# Patient Record
Sex: Female | Born: 1935 | Race: Asian | Hispanic: No | Marital: Married | Smoking: Never smoker
Health system: Southern US, Community
[De-identification: ages and names within clinical notes are randomized; demographics above are authoritative.]

## PROBLEM LIST (undated history)

## (undated) DIAGNOSIS — N390 Urinary tract infection, site not specified: Secondary | ICD-10-CM

---

## 2008-02-07 ENCOUNTER — Emergency Department (HOSPITAL_COMMUNITY): Admission: EM | Admit: 2008-02-07 | Discharge: 2008-02-07 | Payer: Self-pay | Admitting: Emergency Medicine

## 2008-08-20 ENCOUNTER — Emergency Department (HOSPITAL_COMMUNITY): Admission: EM | Admit: 2008-08-20 | Discharge: 2008-08-20 | Payer: Self-pay | Admitting: Emergency Medicine

## 2008-11-09 ENCOUNTER — Emergency Department (HOSPITAL_COMMUNITY): Admission: EM | Admit: 2008-11-09 | Discharge: 2008-11-09 | Payer: Self-pay | Admitting: Emergency Medicine

## 2008-11-13 ENCOUNTER — Emergency Department (HOSPITAL_COMMUNITY): Admission: EM | Admit: 2008-11-13 | Discharge: 2008-11-13 | Payer: Self-pay | Admitting: Family Medicine

## 2009-04-24 ENCOUNTER — Emergency Department (HOSPITAL_COMMUNITY): Admission: EM | Admit: 2009-04-24 | Discharge: 2009-04-24 | Payer: Self-pay | Admitting: Family Medicine

## 2009-12-22 IMAGING — CR DG RIBS W/ CHEST 3+V*L*
3 series · 3 of 3 positions shown · non-contrast
Comparison: Chest x-ray 08/20/2008

CLINICAL DATA: Fell

LEFT RIBS AND CHEST - 3+ VIEW

[w chest pa]
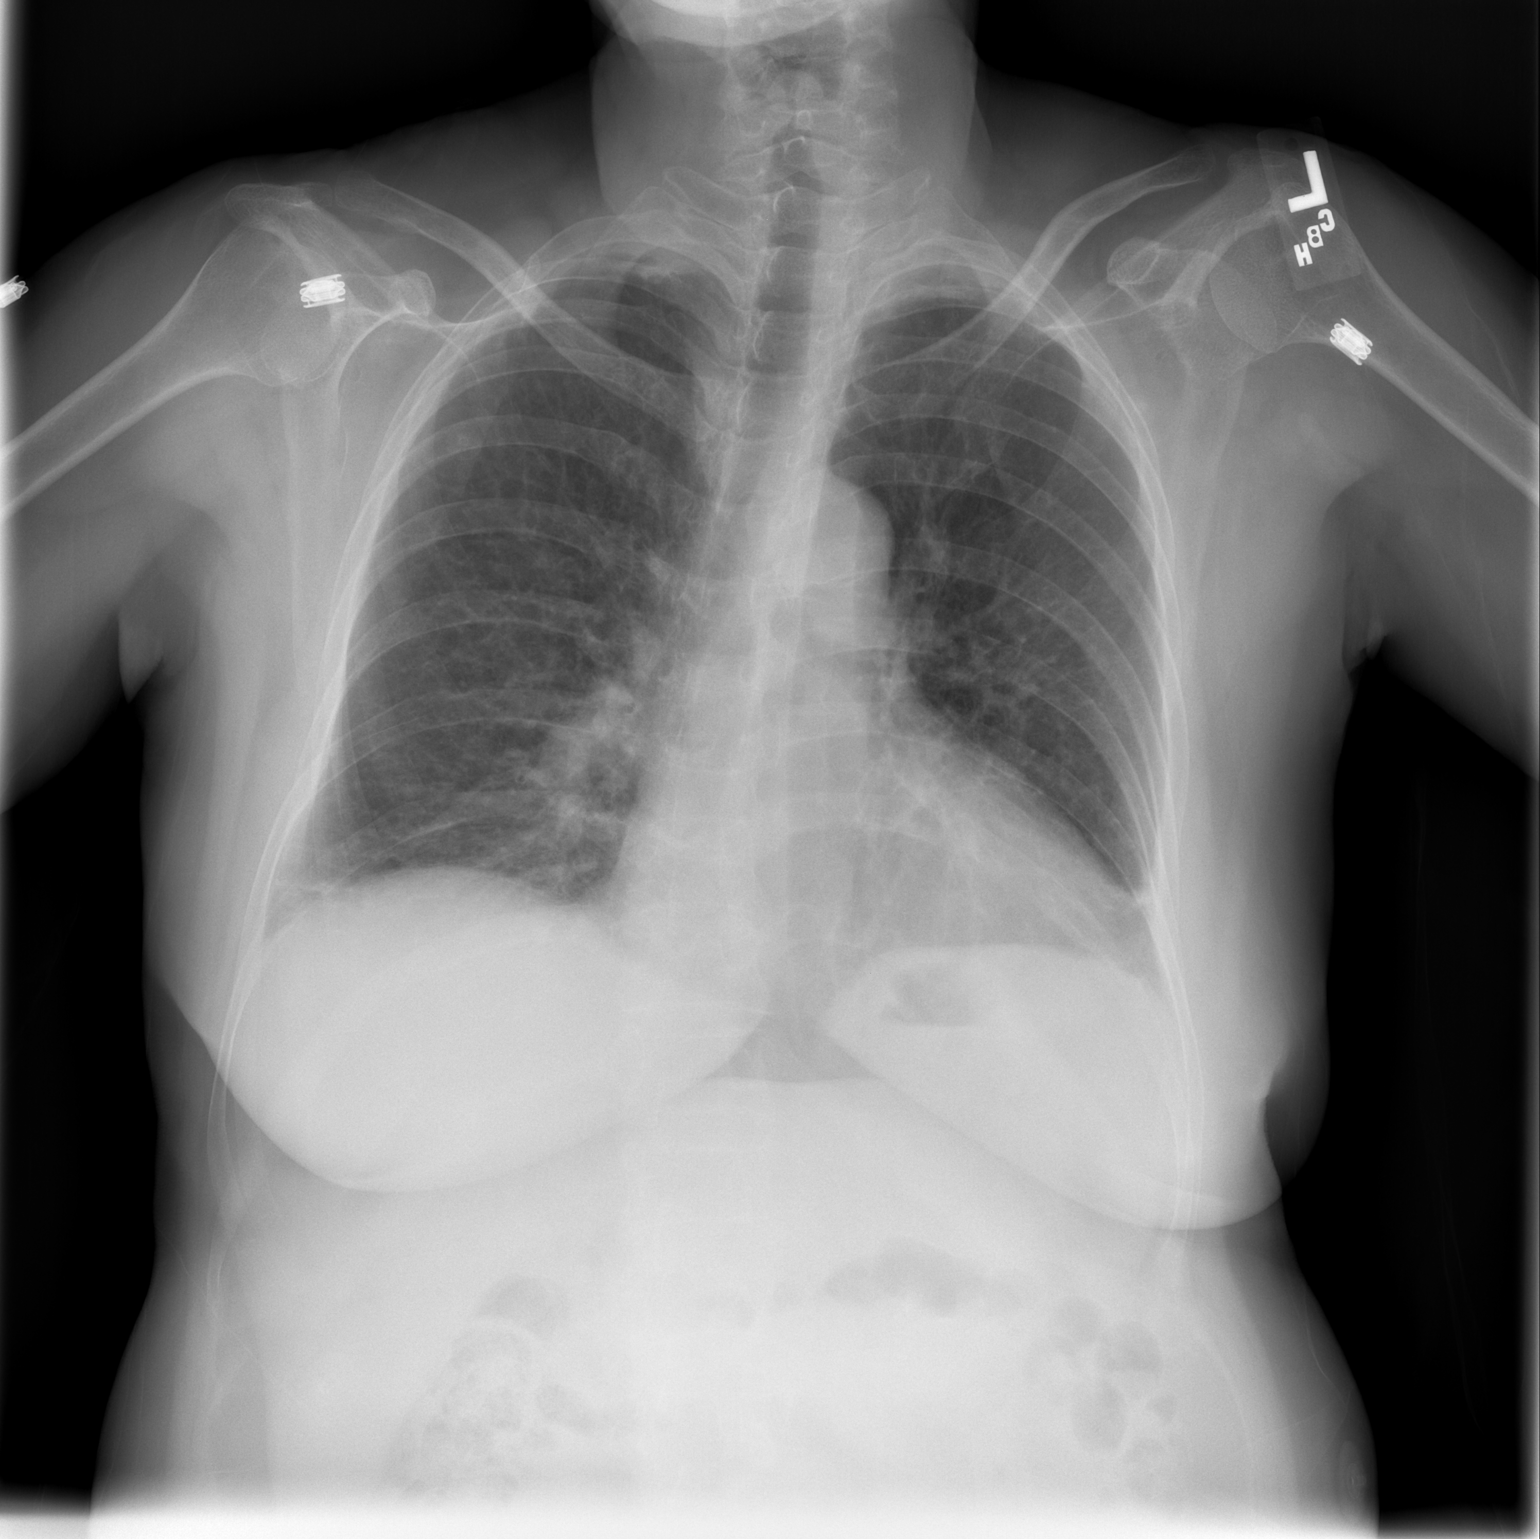

[w ribs ap/pa upper left]
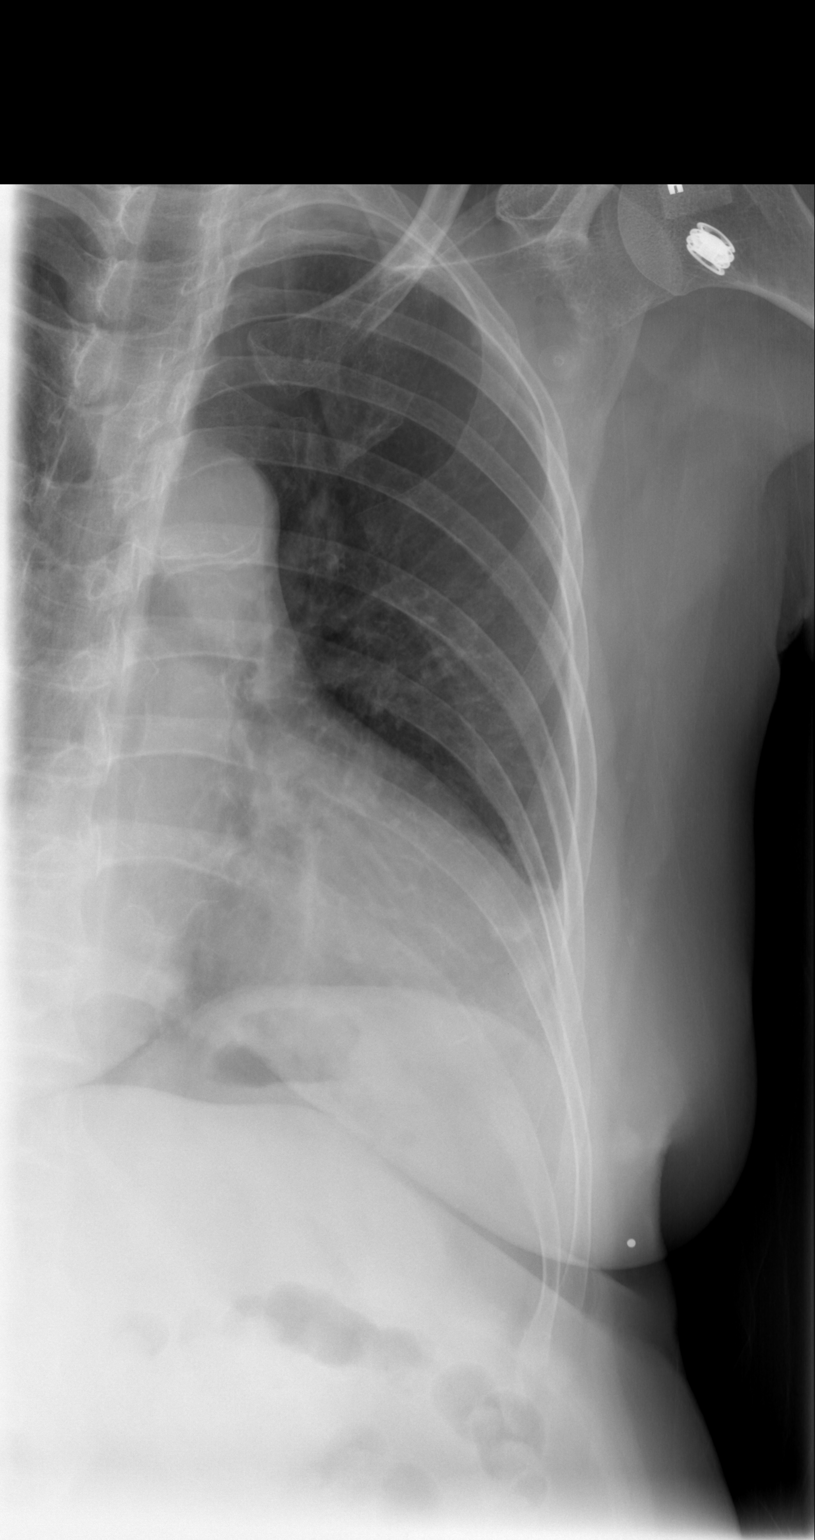

[w ribs oblique left]
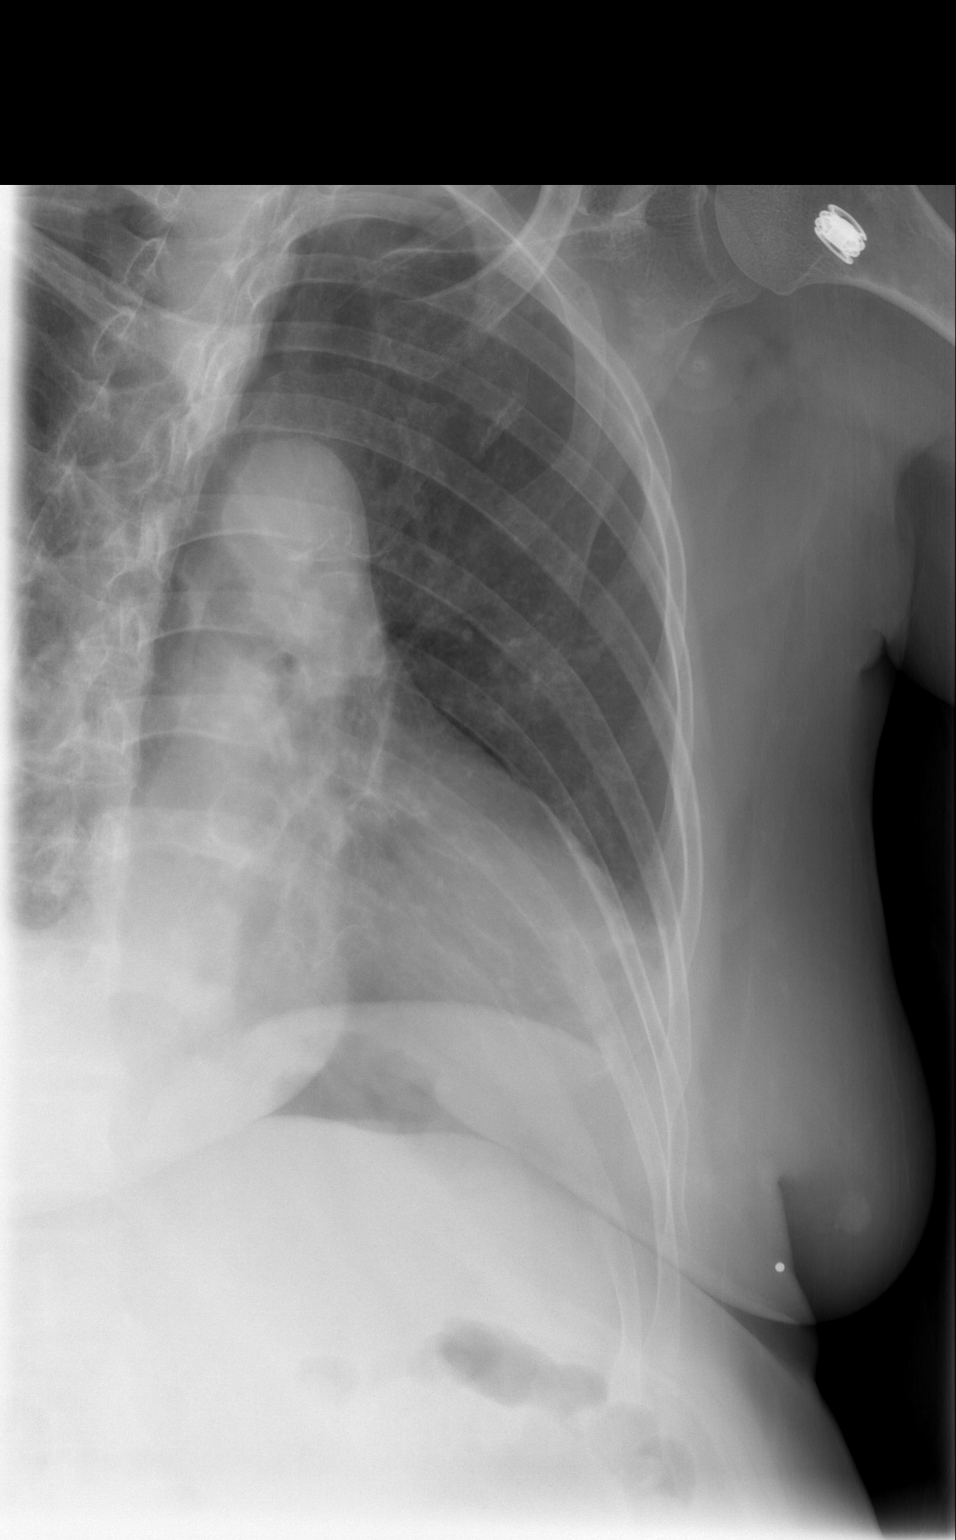

[3 of 3 positions shown; findings below may reference images not displayed]

FINDINGS: The heart is enlarged but stable.  The mediastinal and
hilar contours appear normal.  There are chronic lung changes but
no definite acute pulmonary findings.  Apical scarring is
unchanged.

Dedicated views of the left ribs demonstrate no definite left-sided
rib fractures.  No pleural thickening, pleural effusion or
pneumothorax.
IMPRESSION: 1.  No acute cardiopulmonary findings.  Stable cardiac enlargement.
Minimal streaky bibasilar atelectasis.
2.  No definite acute left-sided rib fractures.
3.  Chronic lung changes.

## 2010-04-03 ENCOUNTER — Emergency Department (HOSPITAL_COMMUNITY): Admission: EM | Admit: 2010-04-03 | Discharge: 2010-04-03 | Payer: Self-pay | Admitting: Family Medicine

## 2010-09-05 ENCOUNTER — Emergency Department (HOSPITAL_COMMUNITY): Admission: EM | Admit: 2010-09-05 | Discharge: 2010-09-05 | Payer: Self-pay | Admitting: Family Medicine

## 2010-10-27 ENCOUNTER — Emergency Department (HOSPITAL_COMMUNITY)
Admission: EM | Admit: 2010-10-27 | Discharge: 2010-10-27 | Payer: Self-pay | Source: Home / Self Care | Admitting: Family Medicine

## 2011-01-26 LAB — POCT URINALYSIS DIP (DEVICE)
Protein, ur: NEGATIVE mg/dL
Specific Gravity, Urine: <=1.005 (ref 1.005–1.030)
Urobilinogen, UA: 0.2 mg/dL (ref 0.0–1.0)

## 2011-02-07 ENCOUNTER — Inpatient Hospital Stay (INDEPENDENT_AMBULATORY_CARE_PROVIDER_SITE_OTHER)
Admission: RE | Admit: 2011-02-07 | Discharge: 2011-02-07 | Disposition: A | Discharge status: Home / Self Care | Payer: Medicaid Other | Source: Ambulatory Visit | Attending: Family Medicine | Admitting: Family Medicine

## 2011-02-07 DIAGNOSIS — K5900 Constipation, unspecified: Secondary | ICD-10-CM

## 2011-02-07 LAB — URINALYSIS, MICROSCOPIC ONLY
Bilirubin Urine: NEGATIVE
Ketones, ur: NEGATIVE mg/dL
Protein, ur: NEGATIVE mg/dL
Urobilinogen, UA: 0.2 mg/dL (ref 0.0–1.0)

## 2011-02-07 LAB — POCT URINALYSIS DIP (DEVICE)
Glucose, UA: NEGATIVE mg/dL
Nitrite: NEGATIVE
Urobilinogen, UA: 0.2 mg/dL (ref 0.0–1.0)

## 2011-02-08 LAB — URINE CULTURE
Colony Count: NO GROWTH
Culture  Setup Time: 201203312059
Culture: NO GROWTH

## 2011-02-16 LAB — POCT URINALYSIS DIP (DEVICE): Urobilinogen, UA: 0.2 mg/dL (ref 0.0–1.0)

## 2011-07-24 ENCOUNTER — Inpatient Hospital Stay (INDEPENDENT_AMBULATORY_CARE_PROVIDER_SITE_OTHER)
Admission: RE | Admit: 2011-07-24 | Discharge: 2011-07-24 | Disposition: A | Discharge status: Home / Self Care | Payer: Medicaid Other | Source: Ambulatory Visit | Attending: Emergency Medicine | Admitting: Emergency Medicine

## 2011-07-24 DIAGNOSIS — M549 Dorsalgia, unspecified: Secondary | ICD-10-CM

## 2011-07-24 DIAGNOSIS — N390 Urinary tract infection, site not specified: Secondary | ICD-10-CM

## 2011-07-24 LAB — POCT URINALYSIS DIP (DEVICE)
Bilirubin Urine: NEGATIVE
Glucose, UA: NEGATIVE mg/dL
Nitrite: NEGATIVE
Urobilinogen, UA: 0.2 mg/dL (ref 0.0–1.0)

## 2011-07-25 LAB — URINE CULTURE: Culture  Setup Time: 201209141658

## 2011-08-03 LAB — POCT I-STAT, CHEM 8
BUN: 16
Calcium, Ion: 1.13
Chloride: 107
Creatinine, Ser: 1.2
Glucose, Bld: 119 — ABNORMAL HIGH
HCT: 38
Hemoglobin: 12.9
Potassium: 3.4 — ABNORMAL LOW
Sodium: 140
TCO2: 25

## 2011-10-08 ENCOUNTER — Emergency Department (HOSPITAL_COMMUNITY)
Admission: EM | Admit: 2011-10-08 | Discharge: 2011-10-08 | Disposition: A | Discharge status: Nursing Home (Includes ICF) | Payer: Medicaid Other | Source: Home / Self Care | Attending: Emergency Medicine | Admitting: Emergency Medicine

## 2011-10-08 ENCOUNTER — Encounter (HOSPITAL_COMMUNITY): Payer: Self-pay | Admitting: *Deleted

## 2011-10-08 ENCOUNTER — Emergency Department (HOSPITAL_COMMUNITY): Payer: Medicaid Other

## 2011-10-08 ENCOUNTER — Emergency Department (HOSPITAL_COMMUNITY)
Admission: EM | Admit: 2011-10-08 | Discharge: 2011-10-09 | Disposition: A | Discharge status: Home / Self Care | Payer: Medicaid Other | Attending: Emergency Medicine | Admitting: Emergency Medicine

## 2011-10-08 ENCOUNTER — Encounter: Payer: Self-pay | Admitting: Emergency Medicine

## 2011-10-08 DIAGNOSIS — R109 Unspecified abdominal pain: Secondary | ICD-10-CM

## 2011-10-08 DIAGNOSIS — R10819 Abdominal tenderness, unspecified site: Secondary | ICD-10-CM | POA: Insufficient documentation

## 2011-10-08 DIAGNOSIS — M25519 Pain in unspecified shoulder: Secondary | ICD-10-CM | POA: Insufficient documentation

## 2011-10-08 DIAGNOSIS — M549 Dorsalgia, unspecified: Secondary | ICD-10-CM | POA: Insufficient documentation

## 2011-10-08 HISTORY — DX: Urinary tract infection, site not specified: N39.0

## 2011-10-08 LAB — URINALYSIS, ROUTINE W REFLEX MICROSCOPIC
Glucose, UA: NEGATIVE mg/dL
Protein, ur: NEGATIVE mg/dL
pH: 7 (ref 5.0–8.0)

## 2011-10-08 LAB — DIFFERENTIAL
Basophils Absolute: 0 10*3/uL (ref 0.0–0.1)
Basophils Relative: 0 % (ref 0–1)
Lymphocytes Relative: 45 % (ref 12–46)
Monocytes Absolute: 0.5 10*3/uL (ref 0.1–1.0)
Neutro Abs: 2.9 10*3/uL (ref 1.7–7.7)
Neutrophils Relative %: 42 % — ABNORMAL LOW (ref 43–77)

## 2011-10-08 LAB — CBC
HCT: 34.5 % — ABNORMAL LOW (ref 36.0–46.0)
MCHC: 32.5 g/dL (ref 30.0–36.0)
Platelets: 180 10*3/uL (ref 150–400)
RDW: 12.9 % (ref 11.5–15.5)
WBC: 6.9 10*3/uL (ref 4.0–10.5)

## 2011-10-08 LAB — COMPREHENSIVE METABOLIC PANEL
ALT: 6 U/L (ref 0–35)
AST: 22 U/L (ref 0–37)
Albumin: 4 g/dL (ref 3.5–5.2)
Alkaline Phosphatase: 93 U/L (ref 39–117)
CO2: 27 mEq/L (ref 19–32)
Chloride: 103 mEq/L (ref 96–112)
Creatinine, Ser: 0.87 mg/dL (ref 0.50–1.10)
GFR calc non Af Amer: 65 mL/min — ABNORMAL LOW (ref >90)
Potassium: 3.4 mEq/L — ABNORMAL LOW (ref 3.5–5.1)
Sodium: 139 mEq/L (ref 135–145)
Total Bilirubin: 0.3 mg/dL (ref 0.3–1.2)

## 2011-10-08 LAB — URINE MICROSCOPIC-ADD ON

## 2011-10-08 MED ORDER — MORPHINE SULFATE 2 MG/ML IJ SOLN
2.0000 mg | Freq: Once | INTRAMUSCULAR | Status: AC
  Administered 2011-10-08: 2 mg via INTRAVENOUS
  Filled 2011-10-08: qty 1

## 2011-10-08 MED ORDER — ONDANSETRON HCL 4 MG/2ML IJ SOLN
4.0000 mg | Freq: Once | INTRAMUSCULAR | Status: AC
  Administered 2011-10-08: 4 mg via INTRAMUSCULAR
  Filled 2011-10-08: qty 2

## 2011-10-08 MED ORDER — SODIUM CHLORIDE 0.9 % IV SOLN
Freq: Once | INTRAVENOUS | Status: AC
  Administered 2011-10-08: 23:00:00 via INTRAVENOUS

## 2011-10-08 NOTE — ED Provider Notes (Signed)
History     CSN: [id number] Arrival date & time: 10/08/2011  3:02 PM   First MD Initiated Contact with Patient 10/08/11 1602      Chief Complaint  Patient presents with  . Abdominal Pain    HPI Comments: 75 y/o female with intermittent burning RUQ pain rad to R shoulder with nausea x 2 days. Pain lasts approx 35 min and resolves. Slightly worse with walking, movement. Reports abd distension, constipation. Last effective BM 1 week ago but had small hard BM yesterday. No improvement in pain with BM.  No vomiting, fevers, wheezing, SOB, cough, urinary c/o. Reports intermittent substernal "burning" pain when lying down and when having this abd pain over past 2 days. Has never had this before. No exertional component. No diaphoresis, palpitations with chest discomfort.  States car ride over here uncomfortable. Pt with no cardiac history.  Patient is a 75 y.o. female presenting with abdominal pain. The history is provided by the patient and a relative. The history is limited by a language barrier. A language interpreter was used.  Abdominal Pain The primary symptoms of the illness include abdominal pain and nausea. The primary symptoms of the illness do not include fever, shortness of breath, vomiting, diarrhea, hematemesis, hematochezia or dysuria. The current episode started 2 days ago. The problem has been gradually worsening.  The patient has had a change in bowel habit. Risk factors for an acute abdominal problem include being elderly. Additional symptoms associated with the illness include heartburn, constipation and back pain. Symptoms associated with the illness do not include diaphoresis, urgency, hematuria or frequency.    Past Medical History  Diagnosis Date  . Constipation   . UTI (lower urinary tract infection)     History reviewed. No pertinent past surgical history.  History reviewed. No pertinent family history.  History  Substance Use Topics  . Smoking status: Never Smoker    . Smokeless tobacco: Not on file  . Alcohol Use: No    OB History    Grav Para Term Preterm Abortions TAB SAB Ect Mult Living                  Review of Systems  Constitutional: Negative for fever and diaphoresis.  Respiratory: Negative for cough, shortness of breath and wheezing.   Cardiovascular: Positive for chest pain.  Gastrointestinal: Positive for heartburn, nausea, abdominal pain, constipation and abdominal distention. Negative for vomiting, diarrhea, hematochezia and hematemesis.  Genitourinary: Negative for dysuria, urgency, frequency and hematuria.  Musculoskeletal: Positive for back pain.  Skin: Negative for rash.    Allergies  Review of patient's allergies indicates no known allergies.  Home Medications  No current outpatient prescriptions on file.  BP 128/67  Pulse 65  Temp(Src) 98.1 F (36.7 C) (Oral)  Resp 18  SpO2 98%  Physical Exam  Nursing note and vitals reviewed. Constitutional: She is oriented to person, place, and time. She appears well-developed and well-nourished.  HENT:  Head: Normocephalic and atraumatic.  Eyes: Conjunctivae and EOM are normal. Pupils are equal, round, and reactive to light.       Conjunctiva pale  Neck: Normal range of motion. Neck supple.  Cardiovascular: Normal rate, regular rhythm, normal heart sounds and intact distal pulses.   No murmur heard. Pulmonary/Chest: Effort normal and breath sounds normal. No respiratory distress. She has no wheezes. She has no rales. She exhibits no tenderness.  Abdominal: Soft. Bowel sounds are normal. She exhibits no distension and no mass. There is tenderness in  the right upper quadrant. There is no rigidity, no rebound, no guarding, no CVA tenderness, no tenderness at McBurney's point and negative Murphy's sign.  Musculoskeletal: Normal range of motion. She exhibits no edema and no tenderness.  Neurological: She is alert and oriented to person, place, and time.  Skin: Skin is warm and  dry.  Psychiatric: She has a normal mood and affect. Her behavior is normal. Judgment and thought content normal.    ED Course  Procedures (including critical care time)  Labs Reviewed - No data to display No results found.   1. Abdominal pain     MDM  H&P most c/w gallbladder irritation with component of constipation. Hx of chest discomfort suggestive of reflux.  Pt has no follow up, speaks no english. Transferring to ED for further evaluation.  Luiz Blare, MD 10/08/11 [phone]

## 2011-10-08 NOTE — ED Notes (Signed)
2 day history of right side pain and shoulder.  Reports right shoulder pain and neck, pain started in right upper chest then moved downward.  Pain hurts into back.  Pain is burning under ribs.  Intermittent nausea/vomitted.  Unsure of last bowel movement, reports stool as hard.

## 2011-10-08 NOTE — ED Notes (Signed)
Pt is here from [facility] for further evaluation of right side pain.  Pain is in her right shoulder and down her right side.  Pt has burning with urination.  Pt has had some chills with this also.

## 2011-10-08 NOTE — ED Provider Notes (Signed)
History     CSN: [id number] Arrival date & time: 10/08/2011  6:45 PM   First MD Initiated Contact with Patient 10/08/11 2218      Chief Complaint  Patient presents with  . Shoulder Pain  . Flank Pain    (Consider location/radiation/quality/duration/timing/severity/associated sxs/prior treatment) Patient is a 75 y.o. female presenting with shoulder pain and flank pain. The history is provided by the patient. The history is limited by a language barrier. Language Interpreter Used: Family at bedside are interpreting.  Shoulder Pain This is a new problem. The current episode started yesterday. The problem occurs intermittently. The problem has been unchanged. Associated symptoms include abdominal pain. Pertinent negatives include no chills or fever. Associated symptoms comments: The pain originates in the RUQ abdomen and extends to right upper back and shoulder. She denies any association with eating. No cough or fever. . Exacerbated by: Worse with certain movements.  Flank Pain Associated symptoms include abdominal pain. Pertinent negatives include no chills or fever.  Shoulder Pain This is a new problem. The current episode started yesterday. The problem occurs intermittently. The problem has been unchanged. Associated symptoms include abdominal pain. Associated symptoms comments: The pain originates in the RUQ abdomen and extends to right upper back and shoulder. She denies any association with eating. No cough or fever. . Exacerbated by: Worse with certain movements.  Flank Pain Associated symptoms include abdominal pain.    Past Medical History  Diagnosis Date  . Constipation   . UTI (lower urinary tract infection)     History reviewed. No pertinent past surgical history.  No family history on file.  History  Substance Use Topics  . Smoking status: Never Smoker   . Smokeless tobacco: Not on file  . Alcohol Use: No    OB History    Grav Para Term Preterm Abortions TAB SAB  Ect Mult Living                  Review of Systems  Constitutional: Negative for fever and chills.  HENT: Negative.   Respiratory: Negative.   Cardiovascular: Negative.   Gastrointestinal: Positive for abdominal pain.       RUQ abdominal pain.  Genitourinary: Positive for flank pain.  Musculoskeletal: Negative.   Skin: Negative.   Neurological: Negative.     Allergies  Review of patient's allergies indicates no known allergies.  Home Medications  No current outpatient prescriptions on file.  BP 151/78  Pulse 79  Temp(Src) 97.8 F (36.6 C) (Oral)  Resp 15  SpO2 99%  Physical Exam  Constitutional: She appears well-developed and well-nourished.  HENT:  Head: Normocephalic.  Neck: Normal range of motion. Neck supple.  Cardiovascular: Normal rate and regular rhythm.   Pulmonary/Chest: Effort normal and breath sounds normal.  Abdominal: Soft. Bowel sounds are normal. She exhibits no mass. There is tenderness. There is no rebound and no guarding.       RUQ abdominal pain to palpation that extends to right lateral abdominal wall. No distention, swelling or discoloration. Normal BS. No mass or splenomegaly.  Musculoskeletal: Normal range of motion.  Neurological: She is alert. No cranial nerve deficit.  Skin: Skin is warm and dry. No rash noted.  Psychiatric: She has a normal mood and affect.    ED Course  Procedures (including critical care time)  Results for orders placed during the hospital encounter of 10/08/11  URINALYSIS, ROUTINE W REFLEX MICROSCOPIC      Component Value Range   Color, Urine YELLOW  YELLOW    APPearance CLEAR  CLEAR    Specific Gravity, Urine 1.008  1.005 - 1.030    pH 7.0  5.0 - 8.0    Glucose, UA NEGATIVE  NEGATIVE (mg/dL)   Hgb urine dipstick SMALL (*) NEGATIVE    Bilirubin Urine NEGATIVE  NEGATIVE    Ketones, ur NEGATIVE  NEGATIVE (mg/dL)   Protein, ur NEGATIVE  NEGATIVE (mg/dL)   Urobilinogen, UA 0.2  0.0 - 1.0 (mg/dL)   Nitrite  NEGATIVE  NEGATIVE    Leukocytes, UA TRACE (*) NEGATIVE   URINE MICROSCOPIC-ADD ON      Component Value Range   Squamous Epithelial / LPF FEW (*) RARE    WBC, UA 0-2  <3 (WBC/hpf)   RBC / HPF 0-2  <3 (RBC/hpf)   Bacteria, UA RARE  RARE   CBC      Component Value Range   WBC 6.9  4.0 - 10.5 (K/uL)   RBC 4.17  3.87 - 5.11 (MIL/uL)   Hemoglobin 11.2 (*) 12.0 - 15.0 (g/dL)   HCT 45.4 (*) 09.8 - 46.0 (%)   MCV 82.7  78.0 - 100.0 (fL)   MCH 26.9  26.0 - 34.0 (pg)   MCHC 32.5  30.0 - 36.0 (g/dL)   RDW 11.9  14.7 - 82.9 (%)   Platelets 180  150 - 400 (K/uL)  DIFFERENTIAL      Component Value Range   Neutrophils Relative 42 (*) 43 - 77 (%)   Neutro Abs 2.9  1.7 - 7.7 (K/uL)   Lymphocytes Relative 45  12 - 46 (%)   Lymphs Abs 3.1  0.7 - 4.0 (K/uL)   Monocytes Relative 8  3 - 12 (%)   Monocytes Absolute 0.5  0.1 - 1.0 (K/uL)   Eosinophils Relative 5  0 - 5 (%)   Eosinophils Absolute 0.4  0.0 - 0.7 (K/uL)   Basophils Relative 0  0 - 1 (%)   Basophils Absolute 0.0  0.0 - 0.1 (K/uL)  COMPREHENSIVE METABOLIC PANEL      Component Value Range   Sodium 139  135 - 145 (mEq/L)   Potassium 3.4 (*) 3.5 - 5.1 (mEq/L)   Chloride 103  96 - 112 (mEq/L)   CO2 27  19 - 32 (mEq/L)   Glucose, Bld 109 (*) 70 - 99 (mg/dL)   BUN 19  6 - 23 (mg/dL)   Creatinine, Ser 5.62  0.50 - 1.10 (mg/dL)   Calcium 9.6  8.4 - 13.0 (mg/dL)   Total Protein 7.7  6.0 - 8.3 (g/dL)   Albumin 4.0  3.5 - 5.2 (g/dL)   AST 22  0 - 37 (U/L)   ALT 6  0 - 35 (U/L)   Alkaline Phosphatase 93  39 - 117 (U/L)   Total Bilirubin 0.3  0.3 - 1.2 (mg/dL)   GFR calc non Af Amer 65 (*) >90 (mL/min)   GFR calc Af Amer 75 (*) >90 (mL/min)  LIPASE, BLOOD      Component Value Range   Lipase 33  11 - 59 (U/L)  POCT I-STAT TROPONIN I      Component Value Range   Troponin i, poc 0.00  0.00 - 0.08 (ng/mL)   Comment 3            US Abdomen Complete  10/09/2011  *RADIOLOGY REPORT*  Clinical Data:  Right upper quadrant abdominal pain.   ABDOMEN ULTRASOUND  Technique:  Complete abdominal ultrasound examination was performed including evaluation of  the liver, gallbladder, bile ducts, pancreas, kidneys, spleen, IVC, and abdominal aorta.  Comparison:  None  Findings:  Gallbladder:  The gallbladder is mildly distended but shows no evidence of calculi, wall thickening, pericholecystic fluid or sonographic Murphy's sign.  Common Bile Duct:  Normal caliber of 3 mm.  Liver:  Normal size and echotexture without focal parenchymal abnormality.  Patent portal vein with hepatopetal flow.  IVC:  Patent throughout its visualized course in the abdomen.  Pancreas:  Although the pancreas is difficult to visualize in its entirety, no focal pancreatic abnormality is identified.  Spleen:  Normal spleen size and echotexture.  Kidneys:  Right kidney measures approximately 7.7 cm.  Simple cyst of the lower pole measures 1.8 cm in greatest diameter.  The left kidney measures 8.0 cm.  Both kidneys show mild cortical thinning. No hydronephrosis.  Abdominal Aorta:  The abdominal aorta is of normal caliber.  IMPRESSION: Mildly distended gallbladder containing no calculi.  Mildly atrophic kidneys.  Original Report Authenticated By: Reola Calkins, M.D.   Dg Abd Acute W/chest  10/08/2011  *RADIOLOGY REPORT*  Clinical Data: Right-sided abdominal pain.  ACUTE ABDOMEN SERIES (ABDOMEN 2 VIEW & CHEST 1 VIEW)  Comparison: Chest x-ray dated 09/05/2010  Findings: Chest shows bibasilar scarring which is stable.  No edema or infiltrates.  Stable heart size.  Stable old left-sided rib fractures.  Abdominal films show no evidence of bowel obstruction or free air. No abnormal calcifications.  Bony structures are unremarkable.  IMPRESSION: No acute findings.  Original Report Authenticated By: Reola Calkins, M.D.     Date: 10/09/2011  Rate: 66  Rhythm: normal sinus rhythm  QRS Axis: normal  Intervals: normal  ST/T Wave abnormalities: normal  Conduction Disutrbances:none   Narrative Interpretation:   Old EKG Reviewed: none available     MDM   The patient reports she is feeling better. Lab studies and imaging resulted without significant findings. Dr. Dierdre Highman in to see patient. Will discharge home.   Rodena Medin, PA 10/09/11 [phone]  I evaluated patient for right shoulder with associated right flank pain the last 2 days. On exam she is reproducible tenderness with movement of her right shoulder as well as reproducible tenderness to palpation over her right chest wall. Negative Murphy's sign and mild right upper quadrant abdominal tenderness. No peritonitis. Ultrasound and labs obtained and reviewed as above. Condition improved with pain medicine. Family bedside and patient agreed to close followup, as well as strict return precautions for any fevers severe pain or worsening condition. Patient may benefit from a height of scan for any persistent symptoms but has no clinical cholecystitis evaluation at this time. Also given that her condition is improved in the emergency department hip films requires admission. Vital signs reviewed within normal limits, afebrile, stable for discharge home at this time. No UTI.  Sunnie Nielsen, MD 10/09/11 [phone]

## 2011-10-08 NOTE — ED Notes (Signed)
Pt to ED for eval of pain; R flank, and R shoulder started two days ago, pain with urination denies blood in urine; pt reports feeling urinary frequency;

## 2011-10-09 ENCOUNTER — Emergency Department (HOSPITAL_COMMUNITY): Payer: Medicaid Other

## 2011-10-09 LAB — POCT I-STAT TROPONIN I: Troponin i, poc: 0 ng/mL (ref 0.00–0.08)

## 2011-10-09 MED ORDER — TRAMADOL HCL 50 MG PO TABS: 50.0000 mg | ORAL_TABLET | Freq: Four times a day (QID) | ORAL | Status: AC | PRN

## 2011-10-09 NOTE — ED Notes (Signed)
Pt remains in US

## 2012-06-13 ENCOUNTER — Emergency Department (HOSPITAL_COMMUNITY)
Admission: EM | Admit: 2012-06-13 | Discharge: 2012-06-13 | Disposition: A | Discharge status: Home / Self Care | Payer: Medicaid Other | Attending: Emergency Medicine | Admitting: Emergency Medicine

## 2012-06-13 ENCOUNTER — Encounter (HOSPITAL_COMMUNITY): Payer: Self-pay | Admitting: Emergency Medicine

## 2012-06-13 DIAGNOSIS — R109 Unspecified abdominal pain: Secondary | ICD-10-CM | POA: Insufficient documentation

## 2012-06-13 DIAGNOSIS — R300 Dysuria: Secondary | ICD-10-CM

## 2012-06-13 LAB — URINALYSIS, ROUTINE W REFLEX MICROSCOPIC
Leukocytes, UA: NEGATIVE
Nitrite: NEGATIVE
Specific Gravity, Urine: 1.006 (ref 1.005–1.030)
Urobilinogen, UA: 0.2 mg/dL (ref 0.0–1.0)

## 2012-06-13 LAB — URINE MICROSCOPIC-ADD ON

## 2012-06-13 MED ORDER — PHENAZOPYRIDINE HCL 200 MG PO TABS: 200.0000 mg | ORAL_TABLET | Freq: Three times a day (TID) | ORAL | Status: AC

## 2012-06-13 NOTE — ED Provider Notes (Signed)
History     CSN: [id number]  Arrival date & time 06/13/12  1003   First MD Initiated Contact with Patient 06/13/12 1017      Chief Complaint  Patient presents with  . Back Pain  . Abdominal Pain     HPI Pt does not speak english, friend with pt translating. Pt c/o lower back and abdominal pain x2 weeks. Pt has had chill, n/v, and difficulty with urination.   Past Medical History  Diagnosis Date  . Constipation   . UTI (lower urinary tract infection)     History reviewed. No pertinent past surgical history.  No family history on file.  History  Substance Use Topics  . Smoking status: Never Smoker   . Smokeless tobacco: Not on file  . Alcohol Use: No    OB History    Grav Para Term Preterm Abortions TAB SAB Ect Mult Living                  Review of Systems  Unable to perform ROS   Allergies  Review of patient's allergies indicates no known allergies.  Home Medications   Current Outpatient Rx  Name Route Sig Dispense Refill  . PHENAZOPYRIDINE HCL 200 MG PO TABS Oral Take 1 tablet (200 mg total) by mouth 3 (three) times daily. 12 tablet 0    BP 132/67  Pulse 66  Temp 97.1 F (36.2 C) (Oral)  Resp 16  SpO2 97%  Physical Exam  Nursing note and vitals reviewed. Constitutional: She is oriented to person, place, and time. She appears well-developed. No distress.  HENT:  Head: Normocephalic and atraumatic.  Eyes: Pupils are equal, round, and reactive to light.  Neck: Normal range of motion.  Cardiovascular: Normal rate and intact distal pulses.   Pulmonary/Chest: No respiratory distress.  Abdominal: Normal appearance. She exhibits no distension and no mass. There is no tenderness. There is no rebound and no guarding.  Musculoskeletal: Normal range of motion.  Neurological: She is alert and oriented to person, place, and time. No cranial nerve deficit.  Skin: Skin is warm and dry. No rash noted.  Psychiatric: She has a normal mood and affect. Her  behavior is normal.    ED Course  Procedures (including critical care time)  Labs Reviewed  URINALYSIS, ROUTINE W REFLEX MICROSCOPIC - Abnormal; Notable for the following:    Color, Urine COLORLESS (*)     Hgb urine dipstick TRACE (*)     All other components within normal limits  URINE MICROSCOPIC-ADD ON  URINE CULTURE   No results found.   1. Dysuria       MDM  Patient encouraged to followup with primary care physician.  Resource guide given.        Nelia Shi, MD 06/13/12 [phone]

## 2012-06-13 NOTE — ED Notes (Signed)
Pt does not speak english, friend with pt translating. Pt c/o lower back and abdominal pain x2 weeks. Pt has had chill, n/v, and difficulty with urination. MD in room at this time

## 2012-06-13 NOTE — ED Notes (Signed)
Pt sitting up in bed. Pt reports still having back and abd pain.

## 2012-06-13 NOTE — ED Notes (Signed)
Pt primary language [place], pt has friend in room that with pt permission is translating.

## 2012-06-14 LAB — URINE CULTURE: Colony Count: 5000

## 2012-08-23 ENCOUNTER — Emergency Department (HOSPITAL_COMMUNITY)
Admission: EM | Admit: 2012-08-23 | Discharge: 2012-08-23 | Disposition: A | Discharge status: Home / Self Care | Payer: Medicaid Other | Source: Home / Self Care

## 2012-08-23 ENCOUNTER — Encounter (HOSPITAL_COMMUNITY): Payer: Self-pay | Admitting: Emergency Medicine

## 2012-08-23 DIAGNOSIS — N3281 Overactive bladder: Secondary | ICD-10-CM

## 2012-08-23 DIAGNOSIS — R7309 Other abnormal glucose: Secondary | ICD-10-CM

## 2012-08-23 DIAGNOSIS — N318 Other neuromuscular dysfunction of bladder: Secondary | ICD-10-CM

## 2012-08-23 DIAGNOSIS — R350 Frequency of micturition: Secondary | ICD-10-CM

## 2012-08-23 DIAGNOSIS — N3289 Other specified disorders of bladder: Secondary | ICD-10-CM

## 2012-08-23 DIAGNOSIS — R739 Hyperglycemia, unspecified: Secondary | ICD-10-CM

## 2012-08-23 LAB — POCT URINALYSIS DIP (DEVICE)
Bilirubin Urine: NEGATIVE
Glucose, UA: 100 mg/dL — AB
Ketones, ur: NEGATIVE mg/dL
Leukocytes, UA: NEGATIVE
Nitrite: NEGATIVE

## 2012-08-23 LAB — POCT I-STAT, CHEM 8
BUN: 16 mg/dL (ref 6–23)
Calcium, Ion: 1.25 mmol/L (ref 1.13–1.30)
Chloride: 105 mEq/L (ref 96–112)
HCT: 40 % (ref 36.0–46.0)
Potassium: 3.5 mEq/L (ref 3.5–5.1)

## 2012-08-23 LAB — CBC WITH DIFFERENTIAL/PLATELET
Basophils Absolute: 0 10*3/uL (ref 0.0–0.1)
HCT: 37.2 % (ref 36.0–46.0)
Hemoglobin: 12 g/dL (ref 12.0–15.0)
Lymphocytes Relative: 35 % (ref 12–46)
Monocytes Absolute: 0.6 10*3/uL (ref 0.1–1.0)
Monocytes Relative: 10 % (ref 3–12)
Neutro Abs: 3 10*3/uL (ref 1.7–7.7)
Neutrophils Relative %: 50 % (ref 43–77)
RDW: 12.4 % (ref 11.5–15.5)
WBC: 6 10*3/uL (ref 4.0–10.5)

## 2012-08-23 MED ORDER — OXYBUTYNIN CHLORIDE ER 5 MG PO TB24: 5.0000 mg | ORAL_TABLET | Freq: Every day | ORAL | Status: AC

## 2012-08-23 NOTE — ED Provider Notes (Signed)
Medical screening examination/treatment/procedure(s) were performed by resident physician or non-physician practitioner and as supervising physician I was immediately available for consultation/collaboration.   Barkley Bruns MD.    Linna Hoff, MD 08/23/12 [phone]

## 2012-08-23 NOTE — ED Notes (Addendum)
Via interpreter... Pt c/o pelvic pressure/pain x2 months... Has had hx of UTI... Sx include: burning when she urinates, nauseas, chills.... Denies: fever, vomiting, diarrhea... Also c/o joint pain all over her body.... Pt has not est w/a PCP.

## 2012-08-23 NOTE — ED Provider Notes (Signed)
History     CSN: [id number]  Arrival date & time 08/23/12  4098   None     Chief Complaint  Patient presents with  . Abdominal Pain    (Consider location/radiation/quality/duration/timing/severity/associated sxs/prior treatment) HPI Comments: 76 year old female presents with a relative that speaks [place]. Speaking for her she takes patient has sharp pelvic pain for one month. Some dysuria and frequency she was told 2 months ago she had a UTI was treated and improved. Today she also has pain across her low back general malaise and just doesn't feel well. She denies fever but does have nausea and headache.  The history is provided by a relative. The history is limited by a language barrier.    Past Medical History  Diagnosis Date  . Constipation   . UTI (lower urinary tract infection)     History reviewed. No pertinent past surgical history.  No family history on file.  History  Substance Use Topics  . Smoking status: Never Smoker   . Smokeless tobacco: Not on file  . Alcohol Use: No    OB History    Grav Para Term Preterm Abortions TAB SAB Ect Mult Living                  Review of Systems  Constitutional: Negative for fever, activity change and fatigue.  Respiratory: Negative for cough, shortness of breath and wheezing.   Cardiovascular: Negative for chest pain and palpitations.  Gastrointestinal: Positive for nausea. Negative for vomiting, abdominal pain and diarrhea.  Genitourinary: Positive for dysuria, frequency, difficulty urinating and pelvic pain. Negative for flank pain, vaginal bleeding and vaginal pain.  Musculoskeletal: Positive for back pain.  Skin: Negative for color change, pallor and rash.  Neurological: Positive for headaches.    Allergies  Review of patient's allergies indicates no known allergies.  Home Medications   Current Outpatient Rx  Name Route Sig Dispense Refill  . OXYBUTYNIN CHLORIDE ER 5 MG PO TB24 Oral Take 1 tablet (5 mg  total) by mouth daily. 30 tablet 0    BP 144/71  Pulse 72  Temp 97.8 F (36.6 C) (Oral)  Resp 18  SpO2 96%  Physical Exam  Nursing note and vitals reviewed. Constitutional: She appears well-developed and well-nourished. No distress.  HENT:  Head: Normocephalic and atraumatic.  Right Ear: External ear normal.  Left Ear: External ear normal.  Mouth/Throat: Oropharynx is clear and moist.  Eyes: EOM are normal. Pupils are equal, round, and reactive to light.  Neck: Normal range of motion. Neck supple.  Cardiovascular: Normal rate and regular rhythm.   Pulmonary/Chest: Effort normal and breath sounds normal. No respiratory distress. She has no wheezes.  Abdominal: Soft. She exhibits no distension. There is no tenderness.       There is tenderness right over the mid suprapubic.  Musculoskeletal: Normal range of motion. She exhibits no edema and no tenderness.  Neurological: She is alert. No cranial nerve deficit.  Skin: Skin is warm and dry. No erythema.  Psychiatric: She has a normal mood and affect.    ED Course  Procedures (including critical care time)  Labs Reviewed  POCT URINALYSIS DIP (DEVICE) - Abnormal; Notable for the following:    Glucose, UA 100 (*)     Hgb urine dipstick SMALL (*)     Protein, ur 30 (*)     All other components within normal limits  POCT I-STAT, CHEM 8 - Abnormal; Notable for the following:    Glucose, Bld  179 (*)     All other components within normal limits  CBC WITH DIFFERENTIAL   No results found.   1. Bladder spasms   2. Urinary frequency   3. OAB (overactive bladder)   4. Hyperglycemia       MDM  No evidence of acute infection today. She certainly may have overactive bladder which may be causing her suprapubic discomfort. The only other abnormal lab is a blood sugar of 179 and this is not fasting. Abdomen or the name of Dr. Julio Sicks to followup with. She should call today for an appointment she may need further workup including  A1c. Due to pain and XL 5 mg 1 daily when necessary overactive bladder. Results for orders placed during the hospital encounter of 08/23/12  POCT URINALYSIS DIP (DEVICE)      Component Value Range   Glucose, UA 100 (*) NEGATIVE mg/dL   Bilirubin Urine NEGATIVE  NEGATIVE   Ketones, ur NEGATIVE  NEGATIVE mg/dL   Specific Gravity, Urine 1.020  1.005 - 1.030   Hgb urine dipstick SMALL (*) NEGATIVE   pH 6.5  5.0 - 8.0   Protein, ur 30 (*) NEGATIVE mg/dL   Urobilinogen, UA 0.2  0.0 - 1.0 mg/dL   Nitrite NEGATIVE  NEGATIVE   Leukocytes, UA NEGATIVE  NEGATIVE  CBC WITH DIFFERENTIAL      Component Value Range   WBC 6.0  4.0 - 10.5 K/uL   RBC 4.44  3.87 - 5.11 MIL/uL   Hemoglobin 12.0  12.0 - 15.0 g/dL   HCT 98.1  19.1 - 47.8 %   MCV 83.8  78.0 - 100.0 fL   MCH 27.0  26.0 - 34.0 pg   MCHC 32.3  30.0 - 36.0 g/dL   RDW 29.5  62.1 - 30.8 %   Platelets 209  150 - 400 K/uL   Neutrophils Relative 50  43 - 77 %   Neutro Abs 3.0  1.7 - 7.7 K/uL   Lymphocytes Relative 35  12 - 46 %   Lymphs Abs 2.1  0.7 - 4.0 K/uL   Monocytes Relative 10  3 - 12 %   Monocytes Absolute 0.6  0.1 - 1.0 K/uL   Eosinophils Relative 5  0 - 5 %   Eosinophils Absolute 0.3  0.0 - 0.7 K/uL   Basophils Relative 1  0 - 1 %   Basophils Absolute 0.0  0.0 - 0.1 K/uL  POCT I-STAT, CHEM 8      Component Value Range   Sodium 142  135 - 145 mEq/L   Potassium 3.5  3.5 - 5.1 mEq/L   Chloride 105  96 - 112 mEq/L   BUN 16  6 - 23 mg/dL   Creatinine, Ser 6.57  0.50 - 1.10 mg/dL   Glucose, Bld 846 (*) 70 - 99 mg/dL   Calcium, Ion 9.62  9.52 - 1.30 mmol/L   TCO2 24  0 - 100 mmol/L   Hemoglobin 13.6  12.0 - 15.0 g/dL   HCT 84.1  32.4 - 40.1 %           Hayden Rasmussen, NP 08/23/12 1006

## 2012-11-17 ENCOUNTER — Ambulatory Visit: Payer: Medicaid Other | Admitting: Dietician

## 2012-11-19 IMAGING — CR DG ABDOMEN ACUTE W/ 1V CHEST
3 series · 3 of 3 positions shown · non-contrast
Comparison: Chest x-ray dated 09/05/2010

CLINICAL DATA: Right-sided abdominal pain.

ACUTE ABDOMEN SERIES (ABDOMEN 2 VIEW & CHEST 1 VIEW)

[w chest pa]
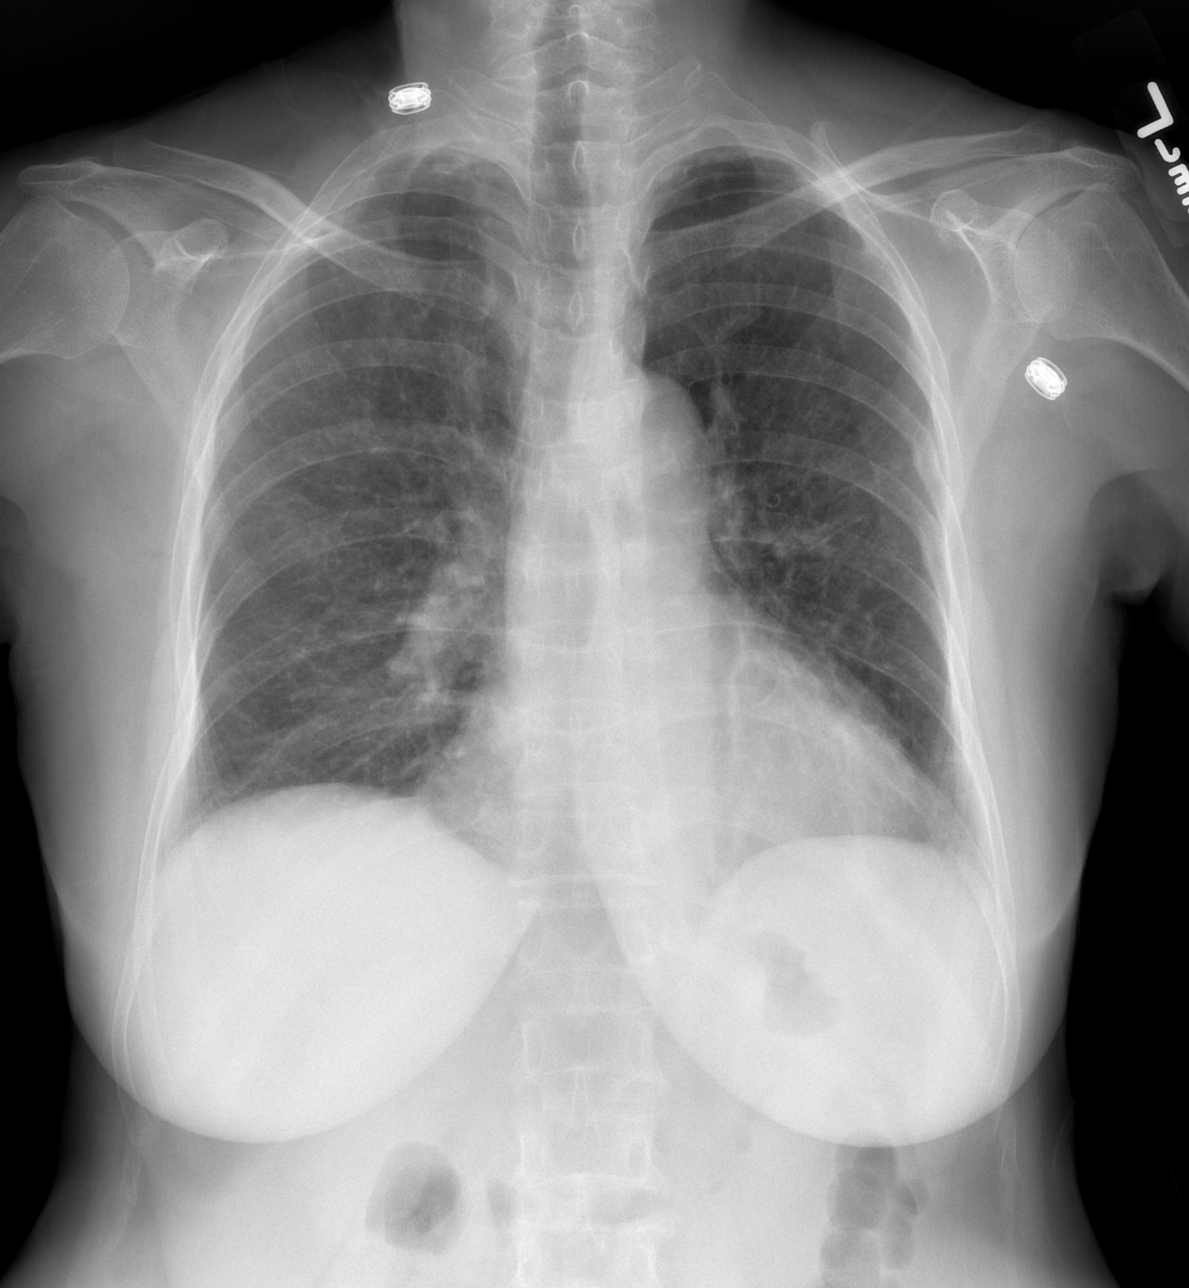

[w abdomen upright]
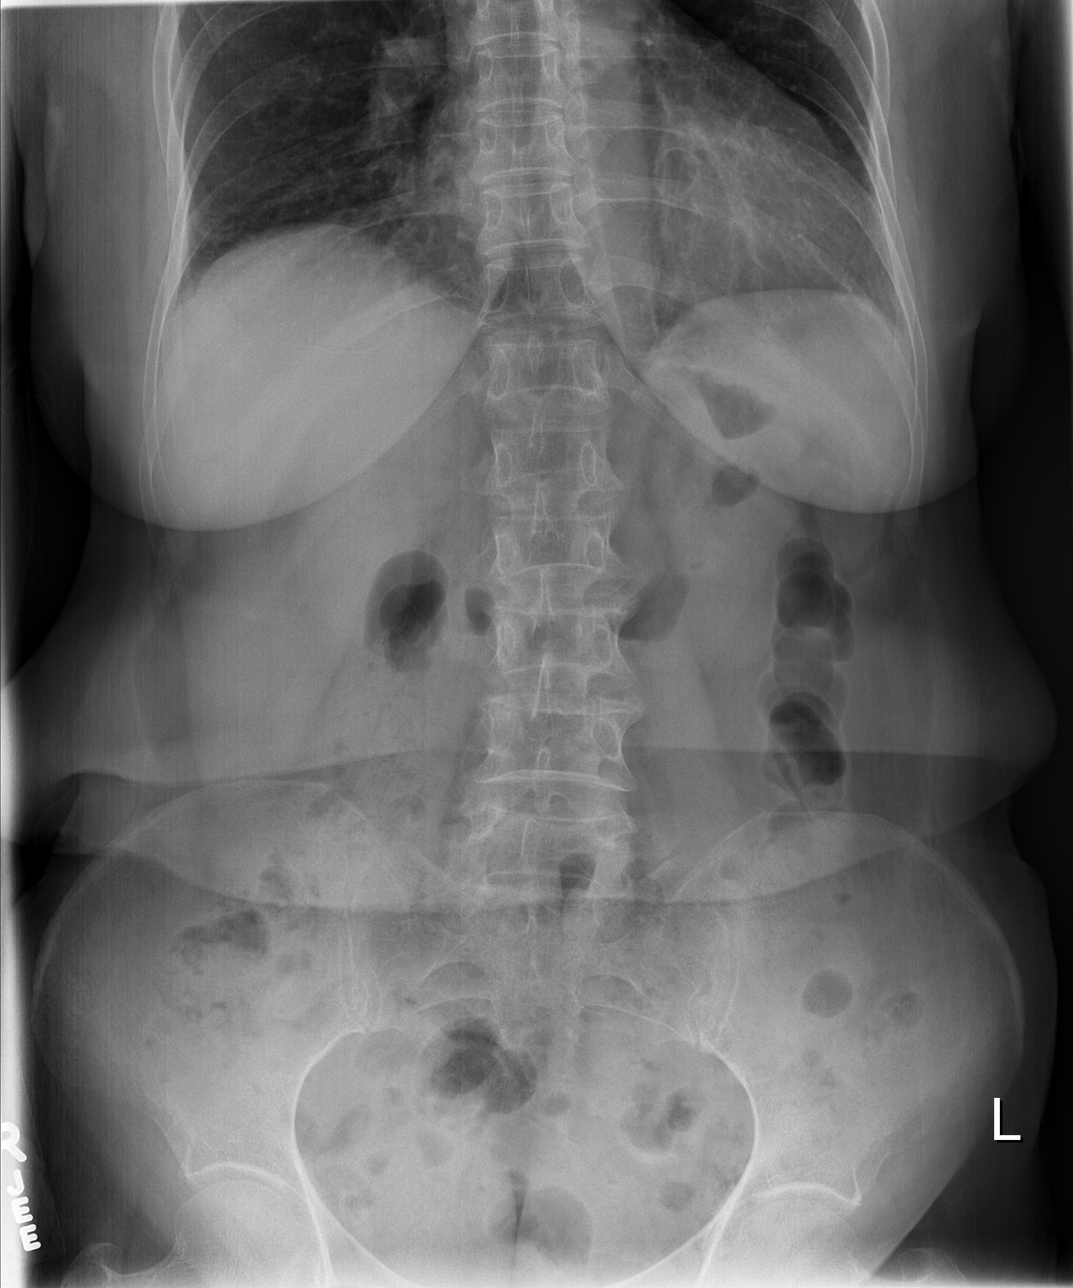

[t abdomen supine]
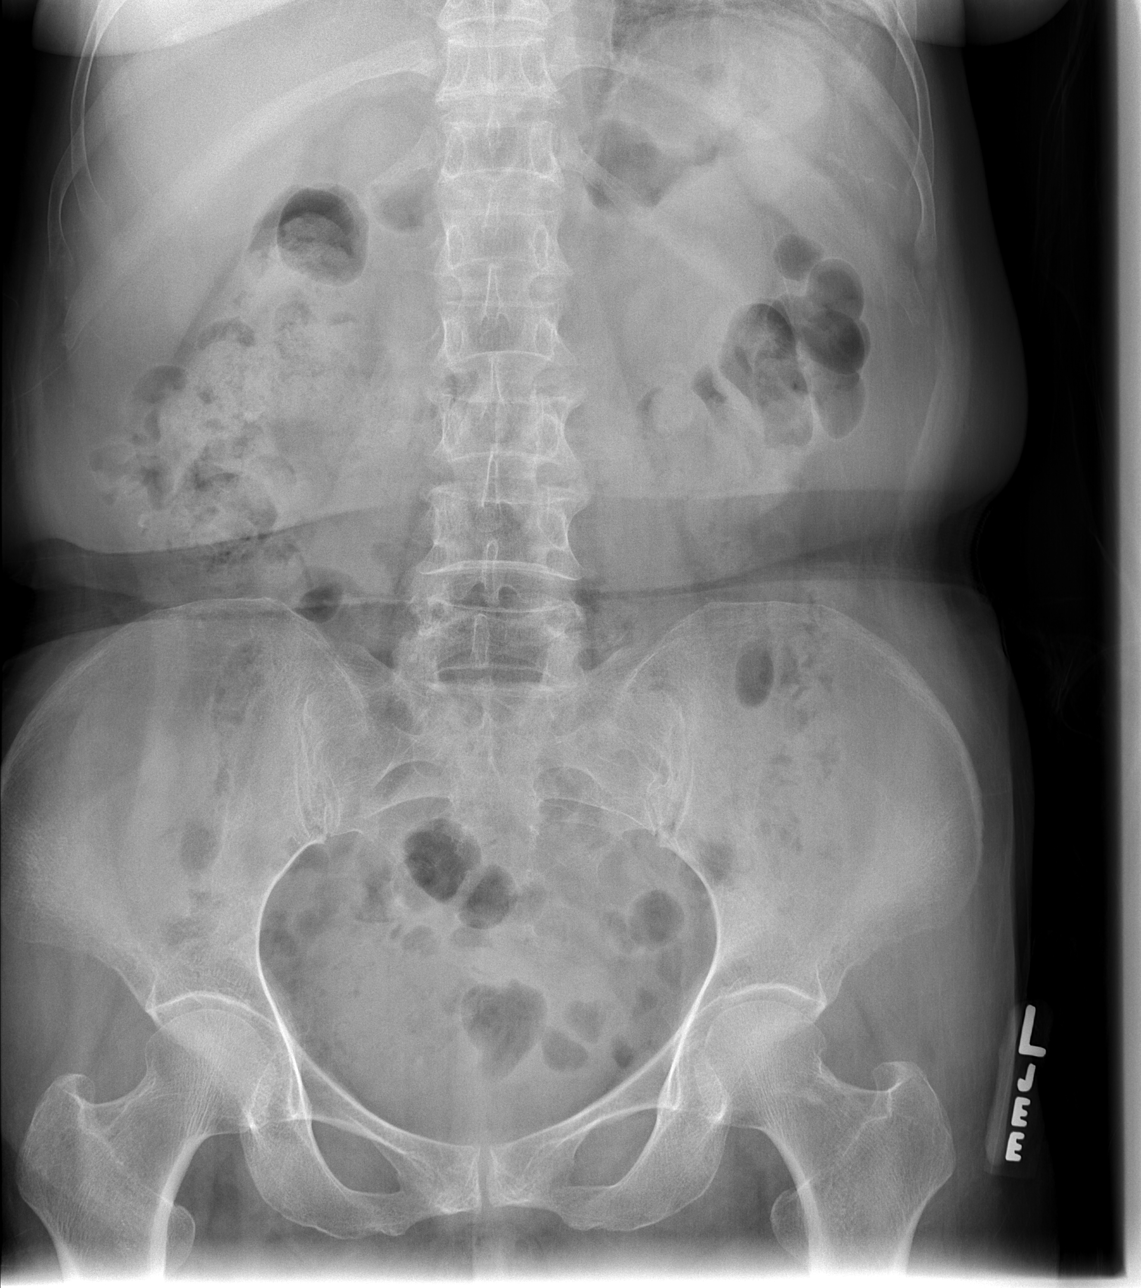

[3 of 3 positions shown; findings below may reference images not displayed]

FINDINGS: Chest shows bibasilar scarring which is stable.  No edema
or infiltrates.  Stable heart size.  Stable old left-sided rib
fractures.

Abdominal films show no evidence of bowel obstruction or free air.
No abnormal calcifications.  Bony structures are unremarkable.
IMPRESSION: No acute findings.

## 2013-08-01 ENCOUNTER — Ambulatory Visit: Payer: Medicaid Other

## 2014-07-11 ENCOUNTER — Encounter: Payer: Self-pay | Admitting: Internal Medicine

## 2014-07-11 ENCOUNTER — Ambulatory Visit: Payer: Medicare Other | Attending: Internal Medicine | Admitting: Internal Medicine

## 2014-07-11 VITALS — BP 150/76 | HR 80 | Temp 98.4°F | Resp 14 | Wt 103.0 lb

## 2014-07-11 DIAGNOSIS — IMO0001 Reserved for inherently not codable concepts without codable children: Secondary | ICD-10-CM

## 2014-07-11 DIAGNOSIS — R634 Abnormal weight loss: Secondary | ICD-10-CM | POA: Diagnosis not present

## 2014-07-11 DIAGNOSIS — R21 Rash and other nonspecific skin eruption: Secondary | ICD-10-CM | POA: Insufficient documentation

## 2014-07-11 DIAGNOSIS — K5900 Constipation, unspecified: Secondary | ICD-10-CM | POA: Insufficient documentation

## 2014-07-11 DIAGNOSIS — H539 Unspecified visual disturbance: Secondary | ICD-10-CM | POA: Diagnosis not present

## 2014-07-11 DIAGNOSIS — R52 Pain, unspecified: Secondary | ICD-10-CM | POA: Insufficient documentation

## 2014-07-11 DIAGNOSIS — R030 Elevated blood-pressure reading, without diagnosis of hypertension: Secondary | ICD-10-CM | POA: Diagnosis not present

## 2014-07-11 LAB — CBC WITH DIFFERENTIAL/PLATELET
Basophils Absolute: 0.1 10*3/uL (ref 0.0–0.1)
Basophils Relative: 1 % (ref 0–1)
Eosinophils Absolute: 0.2 10*3/uL (ref 0.0–0.7)
Eosinophils Relative: 3 % (ref 0–5)
HEMATOCRIT: 37.7 % (ref 36.0–46.0)
HEMOGLOBIN: 12.3 g/dL (ref 12.0–15.0)
LYMPHS PCT: 44 % (ref 12–46)
Lymphs Abs: 2.8 10*3/uL (ref 0.7–4.0)
MCH: 27 pg (ref 26.0–34.0)
MCHC: 32.6 g/dL (ref 30.0–36.0)
MCV: 82.7 fL (ref 78.0–100.0)
MONO ABS: 0.6 10*3/uL (ref 0.1–1.0)
MONOS PCT: 9 % (ref 3–12)
NEUTROS ABS: 2.7 10*3/uL (ref 1.7–7.7)
Neutrophils Relative %: 43 % (ref 43–77)
Platelets: 246 10*3/uL (ref 150–400)
RBC: 4.56 MIL/uL (ref 3.87–5.11)
RDW: 13.6 % (ref 11.5–15.5)
WBC: 6.3 10*3/uL (ref 4.0–10.5)

## 2014-07-11 MED ORDER — ACETAMINOPHEN 500 MG PO TABS: 500.0000 mg | ORAL_TABLET | Freq: Four times a day (QID) | ORAL | Status: AC | PRN

## 2014-07-11 NOTE — Progress Notes (Signed)
Pt is here to establish care. Pt reports having pain in her hand, shoulders, legs and back w/ muscle weakness.

## 2014-07-11 NOTE — Progress Notes (Signed)
   Subjective:    Patient ID: Caitlin Shaffer, female    DOB: 1936/02/19, 78 y.o.   MRN: [id number]  HPI Comments: Patient presents today with c/o of generalized pain.  She reports pain in her shoulders, back, neck, knees, hands, and legs.  She also c/o of rash that has been present for "a long time" on her back and abdomen.  The rash is very pruritic for patient.  She denies any exposure to any new chemicals or cosmetic products. Pain has been present for a couple of months and is worst at night. She denies ever using tobacco, alcohol, or illicit drugs.  She would like to have her immigration papers completed today as well.      Review of Systems  Constitutional: Positive for appetite change and unexpected weight change.  HENT: Negative.   Eyes: Positive for visual disturbance (left eye, 2 years since last eye exam).  Respiratory: Negative.   Cardiovascular: Negative.   Gastrointestinal: Positive for constipation (one BM every 4 days).  Endocrine: Negative.   Genitourinary: Negative.   Musculoskeletal: Positive for arthralgias, back pain, myalgias and neck pain. Negative for joint swelling.  Skin: Positive for rash.  Allergic/Immunologic: Negative.   Neurological: Negative.   Hematological: Negative.        Objective:   Physical Exam  Constitutional: She is oriented to person, place, and time. No distress.  HENT:  Left Ear: External ear normal.  Mouth/Throat: Oropharynx is clear and moist.  Erythematous right TM   Eyes: Conjunctivae and EOM are normal. Pupils are equal, round, and reactive to light.  Neck: Normal range of motion. Neck supple.  Cardiovascular: Normal rate.   Pulmonary/Chest: Effort normal and breath sounds normal.  Abdominal: Soft. Bowel sounds are normal. She exhibits no distension. There is no tenderness.  Musculoskeletal: Normal range of motion. She exhibits no edema and no tenderness.  Neurological: She is oriented to person, place, and time. She has normal  reflexes.  Skin: Skin is warm and dry.          Assessment & Plan:    Caitlin Shaffer was seen today for establish care.  Diagnoses and associated orders for this visit:  Generalized pain - acetaminophen (TYLENOL) 500 MG tablet; Take 1 tablet (500 mg total) by mouth every 6 (six) hours as needed for mild pain.  Elevated BP May require lisinopril for elevated BP on repeat at nurse visit  Loss of weight - COMPLETE METABOLIC PANEL WITH GFR - TSH - CBC with Differential  Return in about 1 week (around 07/18/2014) for Nurse Visit-BP check.  Holland Commons, NP 07/16/2014 2:23 PM

## 2014-07-12 LAB — TSH: TSH: 13.61 u[IU]/mL — AB (ref 0.350–4.500)

## 2014-07-12 LAB — COMPLETE METABOLIC PANEL WITH GFR
ALK PHOS: 129 U/L — AB (ref 39–117)
ALT: <8 U/L (ref 0–35)
AST: 21 U/L (ref 0–37)
Albumin: 4.3 g/dL (ref 3.5–5.2)
BUN: 19 mg/dL (ref 6–23)
CO2: 26 mEq/L (ref 19–32)
CREATININE: 1.14 mg/dL — AB (ref 0.50–1.10)
Calcium: 9.6 mg/dL (ref 8.4–10.5)
Chloride: 104 mEq/L (ref 96–112)
GFR, Est African American: 54 mL/min — ABNORMAL LOW
GFR, Est Non African American: 47 mL/min — ABNORMAL LOW
Glucose, Bld: 179 mg/dL — ABNORMAL HIGH (ref 70–99)
POTASSIUM: 3.4 meq/L — AB (ref 3.5–5.3)
Sodium: 139 mEq/L (ref 135–145)
Total Bilirubin: 0.2 mg/dL (ref 0.2–1.2)
Total Protein: 7.2 g/dL (ref 6.0–8.3)

## 2014-07-18 ENCOUNTER — Ambulatory Visit: Payer: Medicare Other | Attending: Internal Medicine

## 2017-10-19 ENCOUNTER — Ambulatory Visit (HOSPITAL_COMMUNITY)
Admission: EM | Admit: 2017-10-19 | Discharge: 2017-10-19 | Disposition: A | Discharge status: Home / Self Care | Payer: Medicare Other | Attending: Family Medicine | Admitting: Family Medicine

## 2017-10-19 ENCOUNTER — Encounter (HOSPITAL_COMMUNITY): Payer: Self-pay | Admitting: Emergency Medicine

## 2017-10-19 DIAGNOSIS — R300 Dysuria: Secondary | ICD-10-CM

## 2017-10-19 DIAGNOSIS — N3001 Acute cystitis with hematuria: Secondary | ICD-10-CM | POA: Diagnosis not present

## 2017-10-19 LAB — POCT URINALYSIS DIP (DEVICE)
Bilirubin Urine: NEGATIVE
Glucose, UA: NEGATIVE mg/dL
KETONES UR: NEGATIVE mg/dL
Nitrite: POSITIVE — AB
PROTEIN: 100 mg/dL — AB
Specific Gravity, Urine: 1.025 (ref 1.005–1.030)
Urobilinogen, UA: 0.2 mg/dL (ref 0.0–1.0)
pH: 5.5 (ref 5.0–8.0)

## 2017-10-19 MED ORDER — CIPROFLOXACIN HCL 500 MG PO TABS: 500.0000 mg | ORAL_TABLET | Freq: Two times a day (BID) | ORAL | 0 refills | Status: AC

## 2017-10-19 NOTE — ED Triage Notes (Signed)
Pt here with UTI sx x 2 weeks per family; pt dysuria, fever and back pain

## 2017-10-19 NOTE — ED Provider Notes (Signed)
[facility]    CSN: [id number] Arrival date & time: 10/19/17  1011     History   Chief Complaint Chief Complaint  Patient presents with  . Urinary Tract Infection    HPI Caitlin Shaffer is a 81 y.o. female presenting with dysuria. Accompanied by grandchildren who translate for her. States she has had burning with urination for 2 weeks. Back pain and fever. No nausea vomiting, vaginal discharge. No abdominal pain.  HPI  Past Medical History:  Diagnosis Date  . Constipation   . UTI (lower urinary tract infection)     There are no active problems to display for this patient.   History reviewed. No pertinent surgical history.  OB History    No data available       Home Medications    Prior to Admission medications   Medication Sig Start Date End Date Taking? Authorizing Provider  acetaminophen (TYLENOL) 500 MG tablet Take 1 tablet (500 mg total) by mouth every 6 (six) hours as needed for mild pain. 07/11/14   Ambrose FinlandKeck, Valerie A, NP  ciprofloxacin (CIPRO) 500 MG tablet Take 1 tablet (500 mg total) by mouth every 12 (twelve) hours for 7 days. 10/19/17 10/26/17  Pablo Mathurin C, PA-C  oxybutynin (DITROPAN XL) 5 MG 24 hr tablet Take 1 tablet (5 mg total) by mouth daily. 08/23/12   Hayden RasmussenMabe, David, NP    Family History History reviewed. No pertinent family history.  Social History Social History   Tobacco Use  . Smoking status: Never Smoker  Substance Use Topics  . Alcohol use: No  . Drug use: No     Allergies   Patient has no known allergies.   Review of Systems Review of Systems  Constitutional: Positive for fever. Negative for chills.  HENT: Negative for ear pain and sore throat.   Eyes: Negative for pain and visual disturbance.  Respiratory: Negative for cough and shortness of breath.   Cardiovascular: Negative for chest pain and palpitations.  Gastrointestinal: Negative for abdominal pain, nausea and vomiting.  Genitourinary: Positive for difficulty  urinating, dysuria and frequency. Negative for hematuria and vaginal discharge.  Musculoskeletal: Negative for arthralgias and back pain.  Skin: Negative for color change and rash.  Neurological: Negative for dizziness, light-headedness and headaches.  All other systems reviewed and are negative.    Physical Exam Triage Vital Signs ED Triage Vitals [10/19/17 1039]  Enc Vitals Group     BP (!) 100/43     Pulse Rate 78     Resp 18     Temp 98.2 F (36.8 C)     Temp Source Oral     SpO2 98 %     Weight      Height      Head Circumference      Peak Flow      Pain Score      Pain Loc      Pain Edu?      Excl. in GC?    No data found.  Updated Vital Signs BP (!) 100/43 (BP Location: Left Arm) Comment: notified rn  Pulse 78   Temp 98.2 F (36.8 C) (Oral)   Resp 18   SpO2 98%   Visual Acuity Right Eye Distance:   Left Eye Distance:   Bilateral Distance:    Right Eye Near:   Left Eye Near:    Bilateral Near:     Physical Exam  Constitutional: She appears well-developed and well-nourished. No distress.  Small  elderly lady sitting in wheelchair, communicating through 3 grandchildren  HENT:  Head: Normocephalic and atraumatic.  Eyes: Conjunctivae are normal.  Neck: Neck supple.  Cardiovascular: Normal rate and regular rhythm.  No murmur heard. Pulmonary/Chest: Effort normal and breath sounds normal. No respiratory distress.  Abdominal: Soft. There is no tenderness. There is no guarding.  No CVA tenderness  Musculoskeletal: She exhibits no edema.  Neurological: She is alert.  Skin: Skin is warm and dry.  Psychiatric: She has a normal mood and affect.  Nursing note and vitals reviewed.    UC Treatments / Results  Labs (all labs ordered are listed, but only abnormal results are displayed) Labs Reviewed  POCT URINALYSIS DIP (DEVICE) - Abnormal; Notable for the following components:      Result Value   Hgb urine dipstick MODERATE (*)    Protein, ur 100 (*)     Nitrite POSITIVE (*)    Leukocytes, UA SMALL (*)    All other components within normal limits    EKG  EKG Interpretation None       Radiology No results found.  Procedures Procedures (including critical care time)  Medications Ordered in UC Medications - No data to display   Initial Impression / Assessment and Plan / UC Course  I have reviewed the triage vital signs and the nursing notes.  Pertinent labs & imaging results that were available during my care of the patient were reviewed by me and considered in my medical decision making (see chart for details).     UTI treated with cipro for possible pyelo with back pain and fever. Advised tylenol for fever and pain. Return if symptoms not improving with treatment. Will call if culture reveals not sensitive.  Final Clinical Impressions(s) / UC Diagnoses   Final diagnoses:  Dysuria  Acute cystitis with hematuria    ED Discharge Orders        Ordered    ciprofloxacin (CIPRO) 500 MG tablet  Every 12 hours     10/19/17 1108       Controlled Substance Prescriptions [place] Controlled Substance Registry consulted? Not Applicable   Lew DawesWieters, Loralee Weitzman C, [place]PA-C 10/19/17 2018

## 2017-10-19 NOTE — Discharge Instructions (Addendum)
Caitlin Shaffer showed signs of a urinary tract infection in her urine We will treat her with ciprofloxacin, an antibiotic, twice daily for 7 days.  Please use Tylenol or Ibuprofen for fever and pain. Please return if symptoms not improving with treatment.

## 2018-12-24 ENCOUNTER — Ambulatory Visit (HOSPITAL_COMMUNITY)
Admission: EM | Admit: 2018-12-24 | Discharge: 2018-12-24 | Disposition: A | Discharge status: Home / Self Care | Payer: Medicare Other | Attending: Family Medicine | Admitting: Family Medicine

## 2018-12-24 ENCOUNTER — Encounter (HOSPITAL_COMMUNITY): Payer: Self-pay | Admitting: Emergency Medicine

## 2018-12-24 DIAGNOSIS — N390 Urinary tract infection, site not specified: Secondary | ICD-10-CM | POA: Diagnosis not present

## 2018-12-24 LAB — POCT URINALYSIS DIP (DEVICE)
Bilirubin Urine: NEGATIVE
GLUCOSE, UA: NEGATIVE mg/dL
Ketones, ur: NEGATIVE mg/dL
NITRITE: NEGATIVE
PROTEIN: 100 mg/dL — AB
SPECIFIC GRAVITY, URINE: 1.025 (ref 1.005–1.030)
UROBILINOGEN UA: 0.2 mg/dL (ref 0.0–1.0)
pH: 6.5 (ref 5.0–8.0)

## 2018-12-24 MED ORDER — CEPHALEXIN 250 MG PO CAPS: 250.0000 mg | ORAL_CAPSULE | Freq: Two times a day (BID) | ORAL | 0 refills | Status: AC

## 2018-12-24 NOTE — ED Triage Notes (Signed)
Pt here for UTI sx x 3 days with dysuria and frequency

## 2018-12-24 NOTE — ED Provider Notes (Signed)
[facility]    CSN: [id number] Arrival date & time: 12/24/18  1019     History   Chief Complaint Chief Complaint  Patient presents with  . Urinary Tract Infection    HPI Marcelene Moffet is a 83 y.o. female.   HPI  Patient is here with urinary symptoms.  Urinary frequency and dysuria.  Mild suprapubic pain.  No flank pain.  No nausea or vomiting.  No fever.  Slight fatigue.  Decreased appetite.  Still drinking fluids.  She is here with 3 of her children who are interpreting for her.  Fluent English, they deny need for interpreter. They state that she is in good health in general.  On no chronic medications.  She is living with her daughter and grandchildren.  He has some visual impairment.  She has some hearing impairment.  Nothing they feel is unusual for her age.  Past Medical History:  Diagnosis Date  . Constipation   . UTI (lower urinary tract infection)     There are no active problems to display for this patient.   History reviewed. No pertinent surgical history.  OB History   No obstetric history on file.      Home Medications    Prior to Admission medications   Medication Sig Start Date End Date Taking? Authorizing Provider  acetaminophen (TYLENOL) 500 MG tablet Take 1 tablet (500 mg total) by mouth every 6 (six) hours as needed for mild pain. 07/11/14   Ambrose FinlandKeck, Valerie A, NP  cephALEXin (KEFLEX) 250 MG capsule Take 1 capsule (250 mg total) by mouth 2 (two) times daily. 12/24/18   Eustace MooreNelson, Aolani Piggott Sue, MD  oxybutynin (DITROPAN XL) 5 MG 24 hr tablet Take 1 tablet (5 mg total) by mouth daily. 08/23/12   Hayden RasmussenMabe, David, NP    Family History History reviewed. No pertinent family history.  Social History Social History   Tobacco Use  . Smoking status: Never Smoker  Substance Use Topics  . Alcohol use: No  . Drug use: No     Allergies   Patient has no known allergies.   Review of Systems Review of Systems  Constitutional: Positive for appetite change.  Negative for chills and fever.  HENT: Negative for ear pain and sore throat.   Eyes: Negative for pain and visual disturbance.  Respiratory: Negative for cough and shortness of breath.   Cardiovascular: Negative for chest pain and palpitations.  Gastrointestinal: Positive for abdominal pain. Negative for vomiting.  Genitourinary: Positive for dysuria and frequency. Negative for hematuria.  Musculoskeletal: Negative for arthralgias and back pain.  Skin: Negative for color change and rash.  Neurological: Negative for seizures and syncope.  All other systems reviewed and are negative.    Physical Exam Triage Vital Signs ED Triage Vitals  Enc Vitals Group     BP 12/24/18 1053 (!) 122/56     Pulse Rate 12/24/18 1053 81     Resp 12/24/18 1053 18     Temp 12/24/18 1053 97.6 F (36.4 C)     Temp Source 12/24/18 1053 Oral     SpO2 12/24/18 1053 98 %     Weight 12/24/18 1144 96 lb 3.2 oz (43.6 kg)     Height --      Head Circumference --      Peak Flow --      Pain Score 12/24/18 1145 6     Pain Loc --      Pain Edu? --  Excl. in GC? --    No data found.  Updated Vital Signs BP (!) 122/56 (BP Location: Right Arm)   Pulse 81   Temp 97.6 F (36.4 C) (Oral)   Resp 18   Wt 43.6 kg   SpO2 98%       Physical Exam Constitutional:      General: She is not in acute distress.    Appearance: She is well-developed.     Comments: Frail, elderly, small woman.  Walks with a stooped gait taking small steps.  Assisted by grandchildren.  HENT:     Head: Normocephalic and atraumatic.     Mouth/Throat:     Mouth: Mucous membranes are moist.     Comments: Many absent teeth Eyes:     Conjunctiva/sclera: Conjunctivae normal.     Pupils: Pupils are equal, round, and reactive to light.  Neck:     Musculoskeletal: Normal range of motion and neck supple.  Cardiovascular:     Rate and Rhythm: Normal rate and regular rhythm.     Heart sounds: Normal heart sounds.  Pulmonary:     Effort:  Pulmonary effort is normal. No respiratory distress.     Breath sounds: Normal breath sounds.     Comments: Faint crackles at bases Abdominal:     General: Bowel sounds are normal. There is no distension.     Palpations: Abdomen is soft.     Comments: No abdominal tenderness.  No CVAT  Musculoskeletal: Normal range of motion.  Skin:    General: Skin is warm and dry.  Neurological:     General: No focal deficit present.     Mental Status: She is alert.  Psychiatric:        Behavior: Behavior normal.      UC Treatments / Results  Labs (all labs ordered are listed, but only abnormal results are displayed) Labs Reviewed  POCT URINALYSIS DIP (DEVICE) - Abnormal; Notable for the following components:      Result Value   Hgb urine dipstick MODERATE (*)    Protein, ur 100 (*)    Leukocytes,Ua SMALL (*)    All other components within normal limits  URINE CULTURE    EKG None  Radiology No results found.  Procedures Procedures (including critical care time)  Medications Ordered in UC Medications - No data to display  Initial Impression / Assessment and Plan / UC Course  I have reviewed the triage vital signs and the nursing notes.  Pertinent labs & imaging results that were available during my care of the patient were reviewed by me and considered in my medical decision making (see chart for details).     I explained to the family that urinary tract infections are very common in elderly patients, especially in women.  I would recommend an increase in her fluids in general.  Emptied the bladder frequently.  They states she does not have incontinence.  She is independent with toileting. Final Clinical Impressions(s) / UC Diagnoses   Final diagnoses:  Lower urinary tract infectious disease     Discharge Instructions     Drink plenty of fluids Take the keflex antibiotic 2 x a day for 7 days See your doctor if not improving by Monday Return here or to ER if worse at  any time   ED Prescriptions    Medication Sig Dispense Auth. Provider   cephALEXin (KEFLEX) 250 MG capsule Take 1 capsule (250 mg total) by mouth 2 (two) times daily. 14  capsule Eustace Moore, MD     Controlled Substance Prescriptions [place] Controlled Substance Registry consulted? Not Applicable   Eustace Moore, MD 12/24/18 1155

## 2018-12-24 NOTE — Discharge Instructions (Signed)
Drink plenty of fluids Take the keflex antibiotic 2 x a day for 7 days See your doctor if not improving by Monday Return here or to ER if worse at any time

## 2018-12-26 ENCOUNTER — Telehealth (HOSPITAL_COMMUNITY): Payer: Self-pay | Admitting: Emergency Medicine

## 2018-12-26 LAB — URINE CULTURE

## 2018-12-26 NOTE — Telephone Encounter (Signed)
Urine culture was positive for e coli and was given keflex  at urgent care visit. Attempted to reach patient. No answer at this time.
